# Patient Record
Sex: Male | Born: 1995 | Race: Black or African American | Hispanic: No | Marital: Single | Smoking: Current some day smoker
Health system: Southern US, Community
[De-identification: ages and names within clinical notes are randomized; demographics above are authoritative.]

## PROBLEM LIST (undated history)

## (undated) DIAGNOSIS — F909 Attention-deficit hyperactivity disorder, unspecified type: Secondary | ICD-10-CM

---
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
## 2021-07-02 ENCOUNTER — Ambulatory Visit (INDEPENDENT_AMBULATORY_CARE_PROVIDER_SITE_OTHER): Payer: Medicaid Other | Admitting: Licensed Clinical Social Worker

## 2021-07-02 ENCOUNTER — Ambulatory Visit
Admission: EM | Admit: 2021-07-02 | Discharge: 2021-07-02 | Disposition: A | Discharge status: Home / Self Care | Payer: Medicaid Other | Attending: Urgent Care | Admitting: Urgent Care

## 2021-07-02 ENCOUNTER — Encounter (HOSPITAL_COMMUNITY): Payer: Self-pay | Admitting: Licensed Clinical Social Worker

## 2021-07-02 ENCOUNTER — Other Ambulatory Visit: Payer: Self-pay

## 2021-07-02 DIAGNOSIS — M7918 Myalgia, other site: Secondary | ICD-10-CM | POA: Diagnosis present

## 2021-07-02 DIAGNOSIS — F411 Generalized anxiety disorder: Secondary | ICD-10-CM | POA: Diagnosis not present

## 2021-07-02 DIAGNOSIS — Z113 Encounter for screening for infections with a predominantly sexual mode of transmission: Secondary | ICD-10-CM | POA: Diagnosis present

## 2021-07-02 DIAGNOSIS — L0231 Cutaneous abscess of buttock: Secondary | ICD-10-CM | POA: Diagnosis present

## 2021-07-02 DIAGNOSIS — F3181 Bipolar II disorder: Secondary | ICD-10-CM | POA: Insufficient documentation

## 2021-07-02 DIAGNOSIS — F4323 Adjustment disorder with mixed anxiety and depressed mood: Secondary | ICD-10-CM | POA: Insufficient documentation

## 2021-07-02 HISTORY — DX: Attention-deficit hyperactivity disorder, unspecified type: F90.9

## 2021-07-02 MED ORDER — IBUPROFEN 800 MG PO TABS: 800.0000 mg | ORAL_TABLET | Freq: Once | ORAL | Status: DC

## 2021-07-02 MED ORDER — NAPROXEN 500 MG PO TABS: 500.0000 mg | ORAL_TABLET | Freq: Two times a day (BID) | ORAL | 0 refills | Status: AC

## 2021-07-02 MED ORDER — DOXYCYCLINE HYCLATE 100 MG PO CAPS: 100.0000 mg | ORAL_CAPSULE | Freq: Two times a day (BID) | ORAL | 0 refills | Status: AC

## 2021-07-02 NOTE — Progress Notes (Signed)
Comprehensive Clinical Assessment (CCA) Note  07/02/2021 Alex Arellano [id number]  Chief Complaint:  Chief Complaint  Patient presents with   Schizophrenia    Hx on remron    Manic Behavior   Anxiety   Depression   Visit Diagnosis: Bipolar 2 depressive type    Client is a 25 year old male . Client is referred by parole officer for a history of bipolar disorderClient states mental health symptoms as evidenced:   Depression Difficulty Concentrating; Sleep (too much or little); Increase/decrease in appetite; Weight gain/loss Difficulty Concentrating; Sleep (too much or little); Increase/decrease in appetite; Weight gain/loss  Duration of Depressive Symptoms Greater than two weeks Greater than two weeks  Mania Increased Energy[employer]ia. Increased Energy. The comment is Pt reports days he can get 30 mintues of sleep and then wake up feeling like he slept 8 hours. Other days he will sleep all day and still feel tireed.. Taken on 07/02/21 1122 Increased EnergyMania. Increased Energy. The comment is Pt reports days he can get 30 mintues of sleep and then wake up feeling like he slept 8 hours. Other days he will sleep all day and still feel tireed.. Last Filed Value  Anxiety Tension; Worrying Tension; Worrying    Client denies suicidal and homicidal ideations at this time  Client denies hallucinations and delusions at this time   Client was screened for the following SDOH: Smoking, financials, exercise, stress\tension, and social interaction.  Assessment Information that integrates subjective and objective details with a therapist's professional interpretation:   Patient was alert and oriented x3.  Patient was pleasant, cooperative, and maintained good eye contact.  Nathanyel engaged well in therapy session.  Patient presented today with anxious and flat mood\affect.  Patient reports that he got out of prison 3 months ago.  His goal for today is to get back onto his medication management  regime which includes medications for Remeron and Zoloft.  LCSW explained that initial assessment needed to be completed first before patient could be seen by a medication provider.  Patient was agreeable to complete assessment.  Patient endorses symptoms for difficulty concentrating, sleeping sometimes too much for sometimes too little, decreased appetite and weight loss.  Patient reports that there are some days that he can sleep for 8 to 10 hours and feel tired when he wakes up and then there are other days where he can sleep for 30 minutes and feel like he got 8 to 10 hours of sleep.   Patient reports that he has a history of schizophrenia, bipolar disorder, and depression.  Kelvon reports that he has a significant amount of pain ranking it an 8 out of 10 in today's session due to a boil on his back that started about 10 days ago.  LCSW offered information about [facility] for [facility] health and patient will follow up with them in the next 2 days.  LCSW also offered patient bus passes for walk-in appointments for medication management as they are scheduled out 2 to 3 months.  Patient's plan moving forward is to establish with medication provider, take care of boil on his back, and sustain employment.  After those are taking care of patient will review it therapy is manageable.  Client meets criteria for bipolar 2 depressive Client states use of the following substances: History of marijuana     Treatment recommendations are include plan follow-up with medication management team with [facility] in the next 7 business days.  Client was in agreement with treatment recommendations.    CCA Screening, Triage and Referral (STR)  Patient Reported Information How did you hear about [place]? Legal System   Whom do you see for routine medical problems? I don't have a doctor    How Long Has This Been Causing You Problems? 1-6 months  What Do You Feel  Would Help You the Most Today? Medication(s)   Have You Recently Been in Any Inpatient Treatment (Hospital/Detox/Crisis Center/28-Day Program)? No    Have You Ever Received Services From [employer] [facility] Before? No   Have You Recently Had Any Thoughts About Hurting Yourself? No  Are You Planning to Commit Suicide/Harm Yourself At This time? No   Have you Recently Had Thoughts About Hurting Someone Karolee Ohs? No   Have You Used Any Alcohol or Drugs in the Past 24 Hours? No    Do You Currently Have a Therapist/Psychiatrist? No    Have You Been Recently Discharged From Any Office Practice or Programs? No     CCA Screening Triage Referral Assessment Type of Contact: Face-to-Face     Is CPS involved or ever been involved? Never  Is APS involved or ever been involved? Never   Patient Determined To Be At Risk for Harm To Self or Others Based on Review of Patient Reported Information or Presenting Complaint? No    Location of Assessment: [facility]   Does Patient Present under Involuntary Commitment? No   [place] of Residence: [place]      CCA Biopsychosocial Intake/Chief Complaint:  Medication managment. Pt recently got out of prison and is on Remron and possibly zoloft  Current Symptoms/Problems: isolation, lack of motivation,, flat mood affect,   Patient Reported Schizophrenia/Schizoaffective Diagnosis in Past: Yes   Strengths: Pt motivated to find job and f/u for medications  Preferences: none reported  Abilities: basketball   Type of Services Patient Feels are Needed: medication mgnt   Initial Clinical Notes/Concerns: running out of medication.   Mental Health Symptoms Depression:   Difficulty Concentrating; Sleep (too much or little); Increase/decrease in appetite; Weight gain/loss   Duration of Depressive symptoms:  Greater than two weeks   Mania:   Increased Energy (Pt reports days he can get 30 mintues of sleep and then  wake up feeling like he slept 8 hours. Other days he will sleep all day and still feel tireed.)   Anxiety:    Tension; Worrying   Psychosis:   None   Duration of Psychotic symptoms: No data recorded  Trauma:   None   Obsessions:   None   Compulsions:   None   Inattention:   None   Hyperactivity/Impulsivity:   None   Oppositional/Defiant Behaviors:   None   Emotional Irregularity:   None   Other Mood/Personality Symptoms:  No data recorded   Mental Status Exam Appearance and self-care  Stature:   Average   Weight:   Average weight   Clothing:   Casual   Grooming:   Normal   Cosmetic use:   None   Posture/gait:   Normal   Motor activity:   Not Remarkable   Sensorium  Attention:   Normal   Concentration:   Normal   Orientation:   X5   Recall/memory:   Normal   Affect and Mood  Affect:   Anxious; Blunted   Mood:   Anxious   Relating  Eye contact:   Normal   Facial expression:   Anxious   Attitude toward  examiner:   Cooperative   Thought and Language  Speech flow:  Clear and Coherent   Thought content:   Appropriate to Mood and Circumstances   Preoccupation:   None   Hallucinations:   None   Organization:  No data recorded  [employer] of Knowledge:   Fair   Intelligence:   Average   Abstraction:   Functional   Judgement:   Fair   Dance movement psychotherapist:  No data recorded  Insight:   Fair   Decision Making:  No data recorded  Social Functioning  Social Maturity:   Isolates   Social Judgement:  No data recorded  Stress  Stressors:   Armed forces operational officer; Work; Illness; Family conflict (boil on back)   Coping Ability:   Exhausted   Skill Deficits:  No data recorded  Supports:   Family     Religion: Religion/Spirituality Are You A Religious Person?: Yes What is Your Religious Affiliation?: Muslim  Leisure/Recreation: Leisure / Recreation Do You Have Hobbies?: Yes Leisure and Hobbies:  basketball  Exercise/Diet: Exercise/Diet Do You Exercise?: Yes Have You Gained or Lost A Significant Amount of Weight in the Past Six Months?: Yes-Lost Number of Pounds Lost?: 10 Do You Follow a Special Diet?: No Do You Have Any Trouble Sleeping?: Yes Explanation of Sleeping Difficulties: falling and staying asleep   CCA Employment/Education Employment/Work Situation: Employment / Work Situation Employment Situation: Unemployed Patient's Job has Been Impacted by Current Illness: No Has Patient ever Been in Equities trader?: No  Education: Education Last Grade Completed: 10 Did Garment/textile technologist From [employer]?: Yes (GED) Did You Attend College?: No Did You Attend Graduate School?: No Did You Have An Individualized Education Program (IIEP): No Did You Have Any Difficulty At School?: No Patient's Education Has Been Impacted by Current Illness: No   CCA Family/Childhood History Family and Relationship History: Family history Marital status: Single Are you sexually active?: Yes What is your sexual orientation?: hetrosexual  Childhood History:  Childhood History By whom was/is the patient raised?: Mother Description of patient's relationship with caregiver when they were a child: MOther: Good Patient's description of current relationship with people who raised him/her: Mother: Decent live in [place] Does patient have siblings?: Yes Number of Siblings: 3 Description of patient's current relationship with siblings: 1 brother deceased. Sister: alright very young amd brother #2 alright Did patient suffer any verbal/emotional/physical/sexual abuse as a child?: No Did patient suffer from severe childhood neglect?: No Has patient ever been sexually abused/assaulted/raped as an adolescent or adult?: No Was the patient ever a victim of a crime or a disaster?: No Witnessed domestic violence?: No Has patient been affected by domestic violence as an adult?: No  Child/Adolescent Assessment:      CCA Substance Use Alcohol/Drug Use: Alcohol / Drug Use History of alcohol / drug use?: No history of alcohol / drug abuse     DSM5 Diagnoses: Patient Active Problem List   Diagnosis Date Noted   GAD (generalized anxiety disorder) 07/02/2021   Adjustment disorder with mixed anxiety and depressed mood 07/02/2021      Weber Cooks, LCSW

## 2021-07-02 NOTE — ED Provider Notes (Signed)
[facility]   MRN: [id number] DOB: [birth year]  Subjective:   Alex Arellano is a 25 y.o. male presenting for 2-3 week history of persistent and worsening buttock pain, boil. Has had some drainage.  Has been using warm compresses to the area without any relief.  Denies dysuria, hematuria, urinary frequency, penile discharge, penile swelling, testicular pain, testicular swelling, anal pain, groin pain.   He would like an STI check.  Does not want checked for syphilis or HIV.  No current facility-administered medications for this encounter. No current outpatient medications on file.   No Known Allergies  Past Medical History:  Diagnosis Date   ADHD      History reviewed. No pertinent surgical history.  History reviewed. No pertinent family history.  Social History   Tobacco Use   Smoking status: Some Days    Packs/day: 0.50    Types: Cigarettes   Smokeless tobacco: Never  Vaping Use   Vaping Use: Every day  Substance Use Topics   Alcohol use: Not Currently   Drug use: Not Currently    ROS   Objective:   Vitals: BP 118/86 (BP Location: Left Arm)   Pulse 86   Temp 98.5 F (36.9 C) (Oral)   Resp 18   SpO2 97%   Physical Exam Constitutional:      General: He is not in acute distress.    Appearance: Normal appearance. He is well-developed and normal weight. He is not ill-appearing, toxic-appearing or diaphoretic.  HENT:     Head: Normocephalic and atraumatic.     Right Ear: External ear normal.     Left Ear: External ear normal.     Nose: Nose normal.     Mouth/Throat:     Pharynx: Oropharynx is clear.  Eyes:     General: No scleral icterus.       Right eye: No discharge.        Left eye: No discharge.     Extraocular Movements: Extraocular movements intact.     Pupils: Pupils are equal, round, and reactive to light.  Cardiovascular:     Rate and Rhythm: Normal rate.  Pulmonary:     Effort: Pulmonary effort is normal.   Musculoskeletal:     Cervical back: Normal range of motion.  Skin:    General: Skin is warm and dry.       Neurological:     Mental Status: He is alert and oriented to person, place, and time.  Psychiatric:        Mood and Affect: Mood normal.        Behavior: Behavior normal.        Thought Content: Thought content normal.        Judgment: Judgment normal.    PROCEDURE NOTE: I&D of Abscess Verbal consent obtained. Local anesthesia with 3cc of 2% lidocaine with epinephrine. Site cleansed with Betadine. Incision of 1cm was made using an 11 blade, ~20cc expressed consisting of a mixture of pus and serosanguinous fluid. Wound cavity was explored with curved hemostats and loculations loosened. Cleansed and dressed.   Assessment and Plan :   PDMP not reviewed this encounter.  1. Left buttock abscess   2. Buttock pain   3. Screen for STD (sexually transmitted disease)     Successful I&D performed.  Wound care reviewed.  Start doxycycline for the abscess, naproxen for pain and inflammation.  Ibuprofen given in clinic.  Testing for STIs pending.  Counseled patient on potential for adverse  effects with medications prescribed/recommended today, ER and return-to-clinic precautions discussed, patient verbalized understanding.    Wallis Bamberg, PA-C 07/02/21 1438

## 2021-07-02 NOTE — ED Triage Notes (Signed)
2-3 week h/o abscess in the buttock crack that has worsened since the onset. Notes sitting aggravates sxs. Abscess has increased in size since the onset. Pt is also requesting a full STD panel. Denies hematuria, penile discharge and dysuria.

## 2021-07-02 NOTE — Discharge Instructions (Signed)
Please change your dressing 3-5 times daily. Do not apply any ointments or creams. Each time you change your dressing, make sure that you are pressing on the wound to get pus to come out.  Try your best to have a family member help you clean gently around the perimeter of the wound with gentle soap and warm water. Pat your wound dry and let it air out if possible to make sure it is dry before reapplying another dressing.   

## 2021-07-03 LAB — CYTOLOGY, (ORAL, ANAL, URETHRAL) ANCILLARY ONLY
Chlamydia: NEGATIVE
Comment: NEGATIVE
Comment: NEGATIVE
Comment: NORMAL
Neisseria Gonorrhea: NEGATIVE
Trichomonas: NEGATIVE
